# Patient Record
Sex: Female | Born: 1990 | Race: White | Hispanic: No | Smoking: Never smoker
Health system: Southern US, Community
[De-identification: ages and names within clinical notes are randomized; demographics above are authoritative.]

## PROBLEM LIST (undated history)

## (undated) HISTORY — PX: MOUTH SURGERY: SHX715

---

## 2017-07-27 ENCOUNTER — Ambulatory Visit
Admission: EM | Admit: 2017-07-27 | Discharge: 2017-07-27 | Disposition: A | Discharge status: Home / Self Care | Payer: 59 | Attending: Family Medicine | Admitting: Family Medicine

## 2017-07-27 ENCOUNTER — Ambulatory Visit (INDEPENDENT_AMBULATORY_CARE_PROVIDER_SITE_OTHER): Payer: 59

## 2017-07-27 DIAGNOSIS — N200 Calculus of kidney: Secondary | ICD-10-CM

## 2017-07-27 DIAGNOSIS — M545 Low back pain: Secondary | ICD-10-CM

## 2017-07-27 DIAGNOSIS — R1010 Upper abdominal pain, unspecified: Secondary | ICD-10-CM

## 2017-07-27 LAB — URINALYSIS, COMPLETE (UACMP) WITH MICROSCOPIC
Bacteria, UA: NONE SEEN
Glucose, UA: NEGATIVE mg/dL
Leukocytes, UA: NEGATIVE
Nitrite: NEGATIVE
Protein, ur: 100 mg/dL — AB
SPECIFIC GRAVITY, URINE: 1.025 (ref 1.005–1.030)
WBC, UA: NONE SEEN WBC/hpf (ref 0–5)
pH: 6.5 (ref 5.0–8.0)

## 2017-07-27 MED ORDER — KETOROLAC TROMETHAMINE 10 MG PO TABS: 10.0000 mg | ORAL_TABLET | Freq: Four times a day (QID) | ORAL | 0 refills | Status: AC | PRN

## 2017-07-27 MED ORDER — TAMSULOSIN HCL 0.4 MG PO CAPS: 0.4000 mg | ORAL_CAPSULE | Freq: Every day | ORAL | 0 refills | Status: AC

## 2017-07-27 MED ORDER — KETOROLAC TROMETHAMINE 30 MG/ML IJ SOLN
30.0000 mg | Freq: Once | INTRAMUSCULAR | Status: AC
  Administered 2017-07-27: 30 mg via INTRAMUSCULAR

## 2017-07-27 NOTE — Discharge Instructions (Signed)
You have a 3 mm kidney stone on your right side. I have given you a prescription for Toradol for pain, take one tablet every 6 hours and a medicine called Flomax, take one tablet daily. If your symptoms do not resolve in 5 days, go to the emergency room.

## 2017-07-27 NOTE — ED Notes (Signed)
Patient shows no signs of adverse reaction to medication at this time.  

## 2017-07-27 NOTE — ED Triage Notes (Signed)
Right flank pain radiating into right pelvic region. Reports blood in urine. Denies dysuria.

## 2017-07-27 NOTE — ED Provider Notes (Signed)
[facility]    CSN: [id number] Arrival date & time: 07/27/17  1819     History   Chief Complaint Chief Complaint  Patient presents with  . Flank Pain    HPI Kelly Cameron is a 26 y.o. female.   24 hours history of hematuria and right sided flank pain.   The history is provided by the patient.  Dysuria  Pain quality:  Aching and burning Onset quality:  Gradual Duration:  1 day Timing:  Constant Progression:  Worsening Chronicity:  New Recent urinary tract infections: no   Relieved by:  None tried Worsened by:  Nothing Ineffective treatments: water. Urinary symptoms: discolored urine and hematuria   Associated symptoms: abdominal pain and flank pain   Associated symptoms: no fever, no genital lesions, no nausea, no vaginal discharge and no vomiting   Risk factors: recurrent urinary tract infections     History reviewed. No pertinent past medical history.  There are no active problems to display for this patient.   Past Surgical History:  Procedure Laterality Date  . MOUTH SURGERY      OB History    No data available       Home Medications    Prior to Admission medications   Medication Sig Start Date End Date Taking? Authorizing Provider  ketorolac (TORADOL) 10 MG tablet Take 1 tablet (10 mg total) by mouth every 6 (six) hours as needed. 07/27/17   Dorena BodoKennard, Joshual Terrio, NP  tamsulosin (FLOMAX) 0.4 MG CAPS capsule Take 1 capsule (0.4 mg total) by mouth daily. 07/27/17   Dorena BodoKennard, Shawna Kiener, NP    Family History History reviewed. No pertinent family history.  Social History Social History  Substance Use Topics  . Smoking status: Never Smoker  . Smokeless tobacco: Never Used  . Alcohol use No     Allergies   Patient has no known allergies.   Review of Systems Review of Systems  Constitutional: Negative for chills, diaphoresis and fever.  Respiratory: Negative for cough and wheezing.   Cardiovascular: Negative for chest pain and  palpitations.  Gastrointestinal: Positive for abdominal pain. Negative for nausea and vomiting.  Genitourinary: Positive for dysuria, flank pain, frequency and hematuria. Negative for vaginal discharge.  Musculoskeletal: Positive for back pain.  Skin: Negative.   Neurological: Negative.      Physical Exam Triage Vital Signs ED Triage Vitals  Enc Vitals Group     BP 07/27/17 1836 128/85     Pulse Rate 07/27/17 1836 80     Resp 07/27/17 1836 16     Temp 07/27/17 1836 98.8 F (37.1 C)     Temp Source 07/27/17 1836 Oral     SpO2 07/27/17 1836 100 %     Weight 07/27/17 1837 125 lb (56.7 kg)     Height 07/27/17 1837 5\' 5"  (1.651 m)     Head Circumference --      Peak Flow --      Pain Score 07/27/17 1837 8     Pain Loc --      Pain Edu? --      Excl. in GC? --    No data found.   Updated Vital Signs BP 128/85 (BP Location: Left Arm)   Pulse 80   Temp 98.8 F (37.1 C) (Oral)   Resp 16   Ht 5\' 5"  (1.651 m)   Wt 125 lb (56.7 kg)   LMP 07/03/2017 (Within Days) Comment: denies preg, signed preg waiver  SpO2 100%   BMI  20.80 kg/m   Visual Acuity Right Eye Distance:   Left Eye Distance:   Bilateral Distance:    Right Eye Near:   Left Eye Near:    Bilateral Near:     Physical Exam  Constitutional: She is oriented to person, place, and time. She appears well-developed and well-nourished. No distress.  HENT:  Head: Normocephalic and atraumatic.  Right Ear: External ear normal.  Left Ear: External ear normal.  Cardiovascular: Normal rate and regular rhythm.   Pulmonary/Chest: Effort normal and breath sounds normal.  Abdominal: Soft. There is tenderness in the suprapubic area. There is CVA tenderness.  Neurological: She is alert and oriented to person, place, and time.  Skin: Skin is warm and dry. Capillary refill takes less than 2 seconds. No rash noted. She is not diaphoretic. No erythema.  Psychiatric: She has a normal mood and affect. Her behavior is normal.    Nursing note and vitals reviewed.    UC Treatments / Results  Labs (all labs ordered are listed, but only abnormal results are displayed) Labs Reviewed  URINALYSIS, COMPLETE (UACMP) WITH MICROSCOPIC - Abnormal; Notable for the following:       Result Value   Color, Urine RED (*)    APPearance CLOUDY (*)    Hgb urine dipstick LARGE (*)    Bilirubin Urine SMALL (*)    Ketones, ur TRACE (*)    Protein, ur 100 (*)    Squamous Epithelial / LPF 0-5 (*)    All other components within normal limits    EKG  EKG Interpretation None       Radiology Dg Abdomen 1 View  Result Date: 07/27/2017 CLINICAL DATA:  26 year old female with right abdominal pain for 1 day. Diarrhea for 3 days. EXAM: ABDOMEN - 1 VIEW COMPARISON:  None. FINDINGS: The bowel gas pattern is normal. A 3 mm calcification to the right of L4 is noted and a right ureteral calculus is not excluded. No other suspicious calcifications are identified. The bony structures are unremarkable. IMPRESSION: 3 mm right abdominal calcification at the level of L4 - right ureteral calculus not excluded. Correlate clinically. Unremarkable bowel gas pattern. Electronically Signed   By: Harmon Pier M.D.   On: 07/27/2017 19:11    Procedures Procedures (including critical care time)  Medications Ordered in UC Medications  ketorolac (TORADOL) 30 MG/ML injection 30 mg (30 mg Intramuscular Given 07/27/17 1924)     Initial Impression / Assessment and Plan / UC Course  I have reviewed the triage vital signs and the nursing notes.  Pertinent labs & imaging results that were available during my care of the patient were reviewed by me and considered in my medical decision making (see chart for details).   Treating for kidney stone, given toradol and rx for Toradol and Flomax. If symptoms persist past 5 days, go to the ER.   Final Clinical Impressions(s) / UC Diagnoses   Final diagnoses:  Kidney stone    New Prescriptions Discharge  Medication List as of 07/27/2017  7:32 PM    START taking these medications   Details  ketorolac (TORADOL) 10 MG tablet Take 1 tablet (10 mg total) by mouth every 6 (six) hours as needed., Starting Mon 07/27/2017, Print    tamsulosin (FLOMAX) 0.4 MG CAPS capsule Take 1 capsule (0.4 mg total) by mouth daily., Starting Mon 07/27/2017, Print         Controlled Substance Prescriptions [place] Controlled Substance Registry consulted? No   Dorena Bodo, NP  07/27/17 2012  

## 2018-12-06 IMAGING — CR DG ABDOMEN 1V
2 series · 2 of 2 positions shown · non-contrast
Comparison: None.

CLINICAL DATA: 26-year-old female with right abdominal pain for 1
day. Diarrhea for 3 days.

EXAM:
ABDOMEN - 1 VIEW

[abdomen kub (1 of 2)]
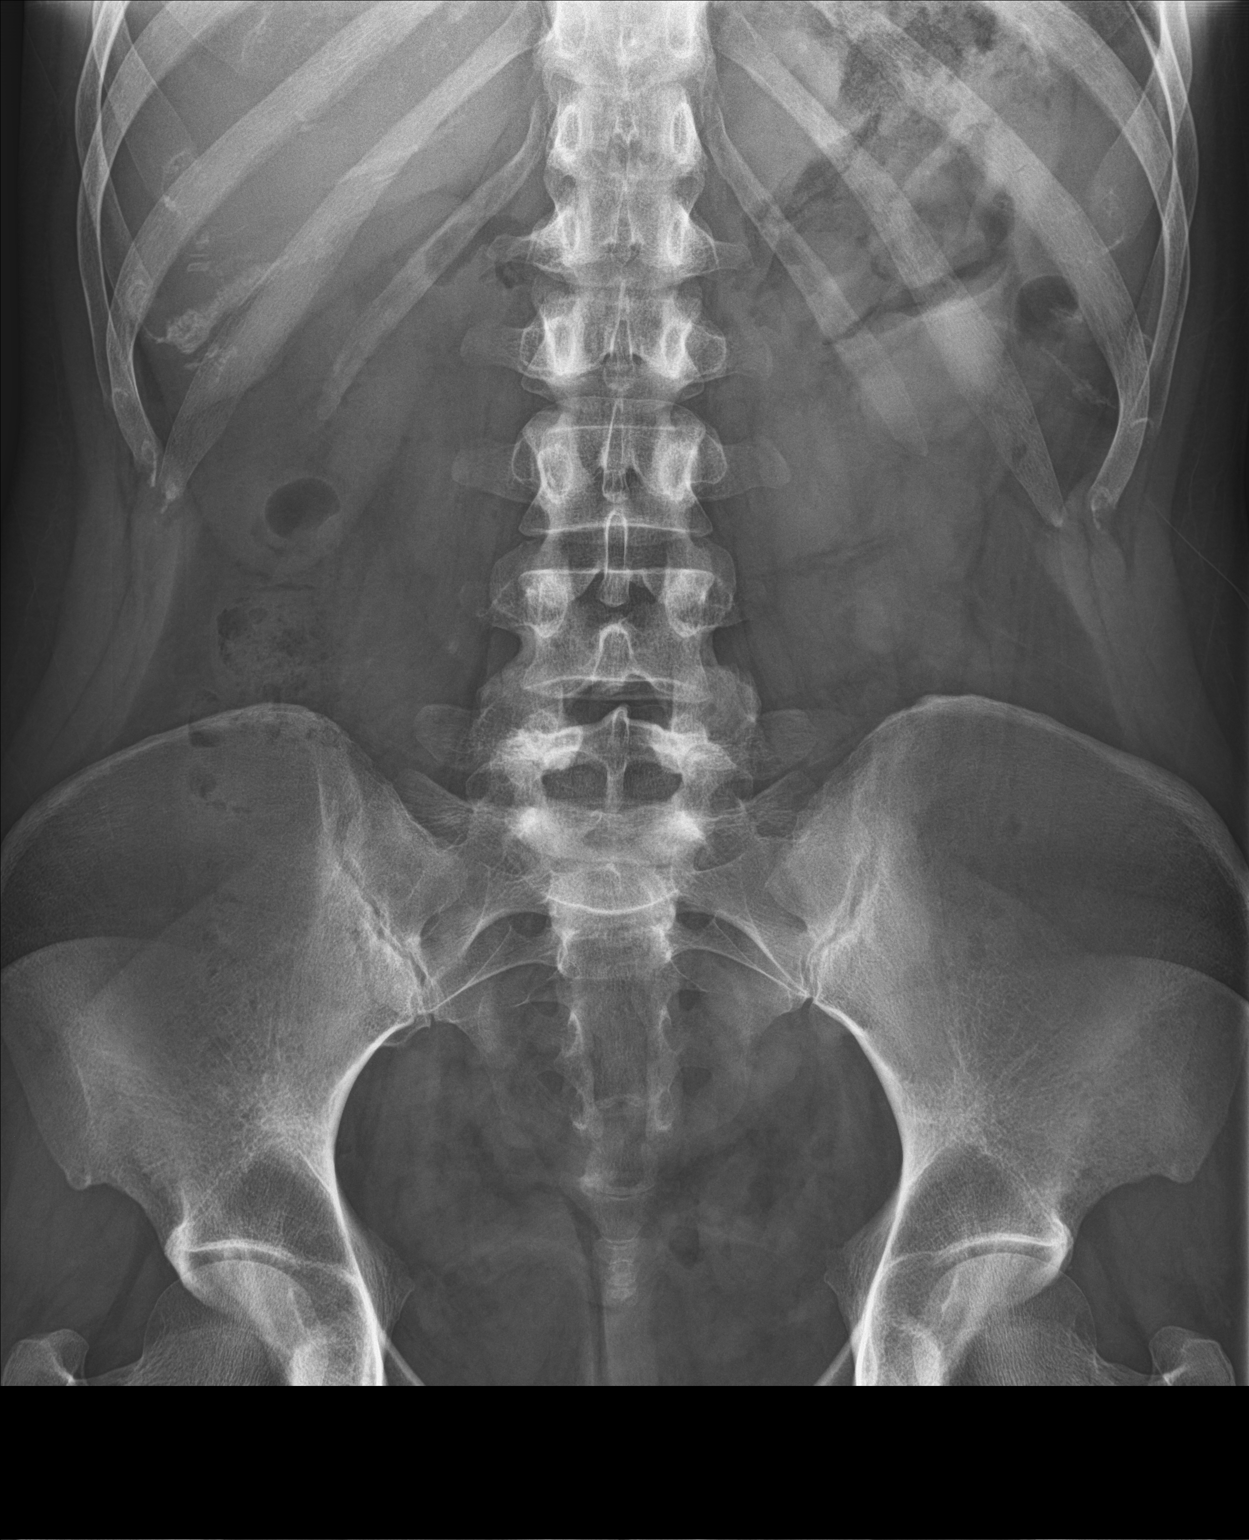

[abdomen kub (2 of 2)]
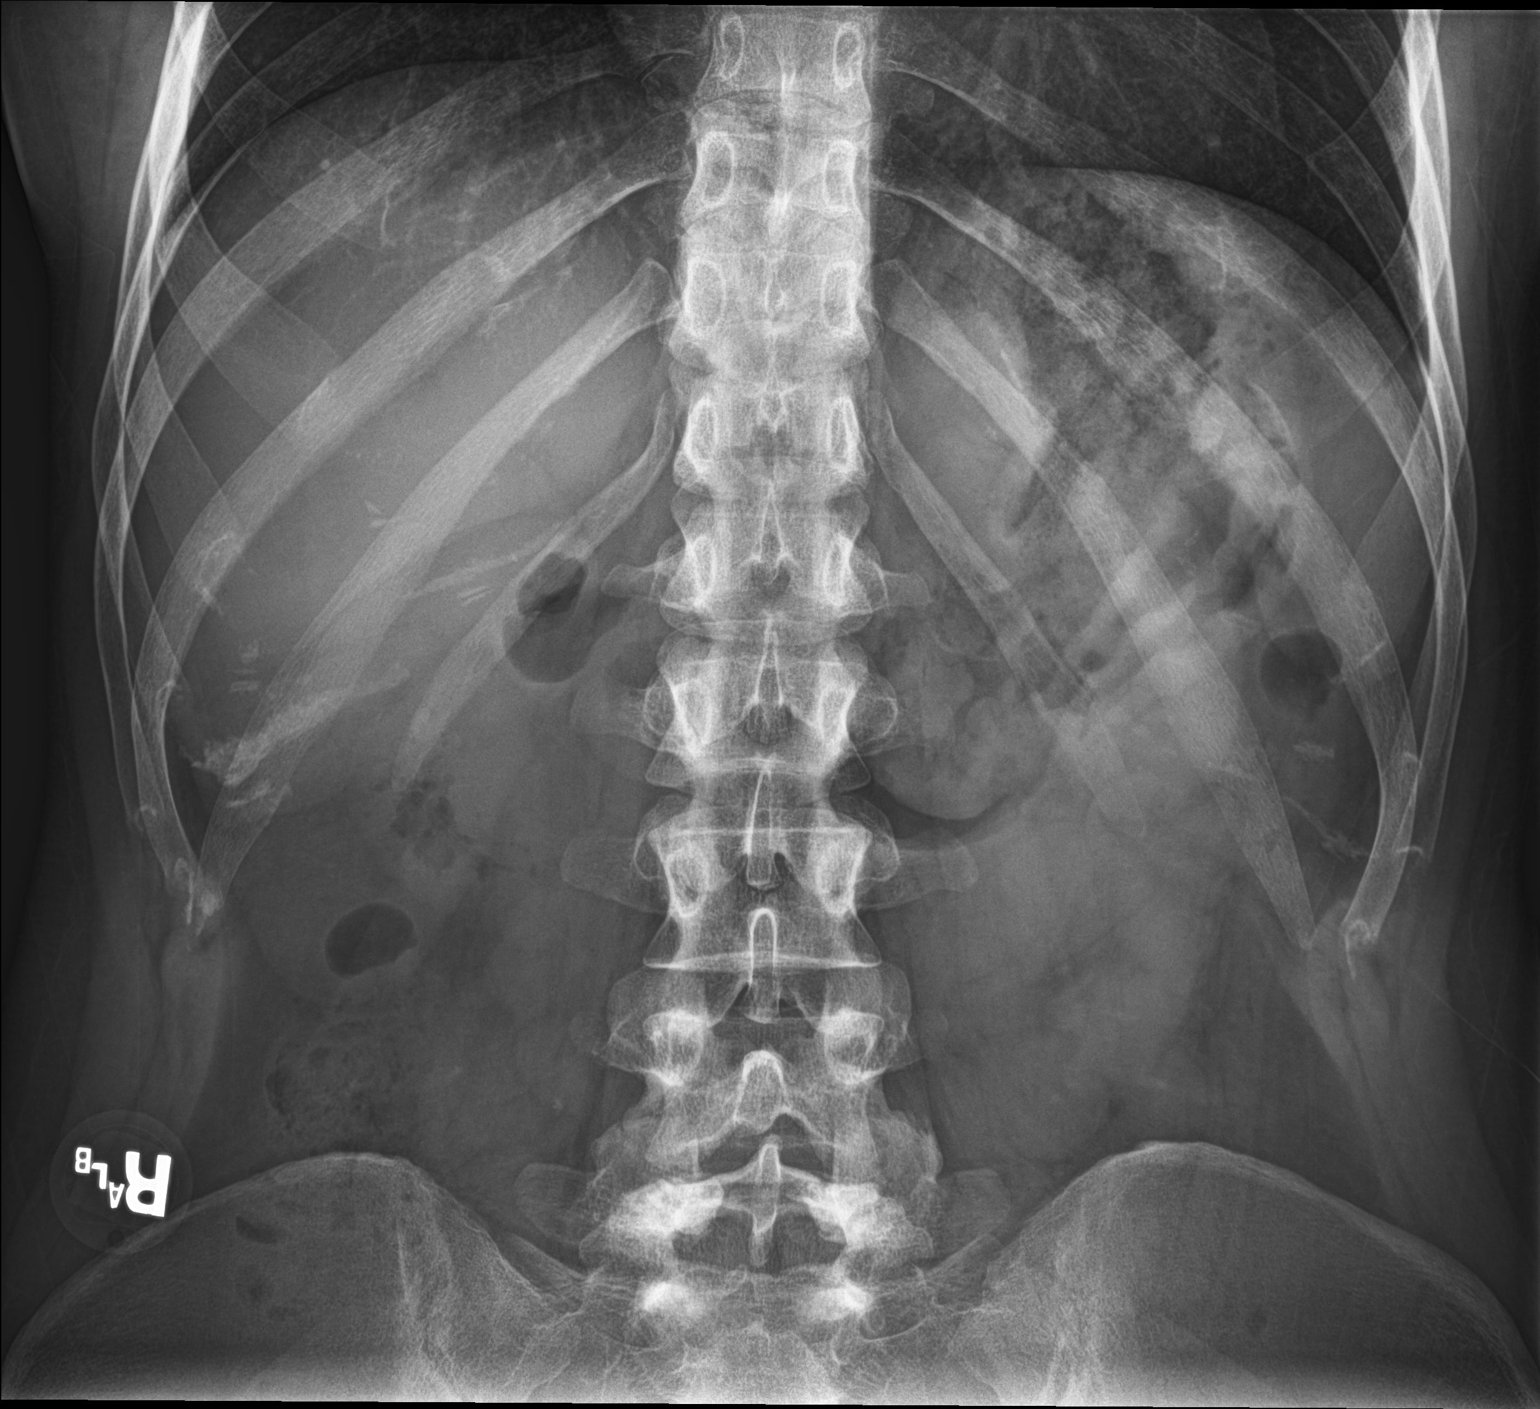

[2 of 2 positions shown; findings below may reference images not displayed]

FINDINGS: The bowel gas pattern is normal.

A 3 mm calcification to the right of L4 is noted and a right
ureteral calculus is not excluded.

No other suspicious calcifications are identified.

The bony structures are unremarkable.
IMPRESSION: 3 mm right abdominal calcification at the level of L4 - right
ureteral calculus not excluded. Correlate clinically.

Unremarkable bowel gas pattern.
